# Patient Record
Sex: Male | Born: 1990 | Race: White | Hispanic: No | Marital: Single | Smoking: Never smoker
Health system: Southern US, Community
[De-identification: ages and names within clinical notes are randomized; demographics above are authoritative.]

## PROBLEM LIST (undated history)

## (undated) DIAGNOSIS — K219 Gastro-esophageal reflux disease without esophagitis: Secondary | ICD-10-CM

## (undated) DIAGNOSIS — R011 Cardiac murmur, unspecified: Secondary | ICD-10-CM

## (undated) DIAGNOSIS — F32A Depression, unspecified: Secondary | ICD-10-CM

## (undated) DIAGNOSIS — F329 Major depressive disorder, single episode, unspecified: Secondary | ICD-10-CM

## (undated) DIAGNOSIS — J45909 Unspecified asthma, uncomplicated: Secondary | ICD-10-CM

## (undated) HISTORY — DX: Major depressive disorder, single episode, unspecified: F32.9

## (undated) HISTORY — DX: Cardiac murmur, unspecified: R01.1

## (undated) HISTORY — DX: Gastro-esophageal reflux disease without esophagitis: K21.9

## (undated) HISTORY — DX: Depression, unspecified: F32.A

## (undated) HISTORY — PX: NO PAST SURGERIES: SHX2092

## (undated) HISTORY — DX: Unspecified asthma, uncomplicated: J45.909

---
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
## 2015-12-23 ENCOUNTER — Ambulatory Visit (INDEPENDENT_AMBULATORY_CARE_PROVIDER_SITE_OTHER): Payer: BC Managed Care – PPO | Admitting: Family Medicine

## 2015-12-23 ENCOUNTER — Encounter: Payer: Self-pay | Admitting: Family Medicine

## 2015-12-23 VITALS — BP 110/72 | HR 66 | Temp 98.7°F | Ht 68.8 in | Wt 155.4 lb

## 2015-12-23 DIAGNOSIS — F410 Panic disorder [episodic paroxysmal anxiety] without agoraphobia: Secondary | ICD-10-CM

## 2015-12-23 DIAGNOSIS — D229 Melanocytic nevi, unspecified: Secondary | ICD-10-CM

## 2015-12-23 MED ORDER — SERTRALINE HCL 50 MG PO TABS: ORAL_TABLET | ORAL | Status: AC

## 2015-12-23 NOTE — Assessment & Plan Note (Signed)
Patient's symptoms most consistent with anxiety and panic attacks. Unlikely to be a cardiac cause in a patient of his age with no risk factors, no family history, and no exertional component. Discussed treatment methods including SSRI, benzodiazepines, and/or therapy. Patient opted for SSRI. We will start him on Zoloft as outlined below. He is given return precautions.

## 2015-12-23 NOTE — Patient Instructions (Signed)
Nice to meet you. We're going to start you on Zoloft for anxiety. If you would like to see a therapist please let us know. Please monitor the mole and operative your head and if it changes further please let us know. If you develop persistent anxiety, chest pain, shortness of breath, palpitations, sweating, thoughts of harming your self or others, or any new or changing symptoms please seek medical attention.

## 2015-12-23 NOTE — Progress Notes (Signed)
Patient ID: Marcus Lynch, male   DOB: [birth year], 26 y.o.   MRN: [id number]  Tommi Rumps, MD Phone: [phone]  Marcus Lynch is a 25 y.o. male who presents today for new patient visit.  Anxiety: Patient notes history of anxiety in the past. Has a history of panic attacks as well. All since ninth grade. Notes these occur intermittently. He does get panic attacks frequently recently. Describes it as an anxiety and nervous sensation followed by tightness in his chest and mild difficulty breathing. No diaphoresis associated with this. Can occur at any time though have been occurring more often at night. Not exertional. Notes his family has a significant history of anxiety. No depression currently. No SI or HI. No family history of cardiac disease. Patient does not have a history of diabetes, hypertension, or hyperlipidemia. Has never smoked cigarettes. Are consistent with his prior panic attacks. No anxiety at this time. No chest pain or shortness of breath at this time.  Patient also reports a new mole on top of his head that has grown in size. Has not changed colors. Occasionally it does bleed.  Active Ambulatory Problems    Diagnosis Date Noted  . Panic attacks 12/23/2015  . Nevus 12/23/2015   Resolved Ambulatory Problems    Diagnosis Date Noted  . No Resolved Ambulatory Problems   Past Medical History  Diagnosis Date  . Depression   . GERD (gastroesophageal reflux disease)   . Heart murmur   . Asthma     Family History  Problem Relation Age of Onset  . Diabetes Father     Social History   Social History  . Marital Status: Single    Spouse Name: N/A  . Number of Children: N/A  . Years of Education: N/A   Occupational History  . Not on file.   Social History Main Topics  . Smoking status: Never Smoker   . Smokeless tobacco: Never Used  . Alcohol Use: No  . Drug Use: No  . Sexual Activity:    Partners: Female    Museum/gallery curator: None   Other Topics  Concern  . Not on file   Social History Narrative  . No narrative on file    ROS  General:  Negative for nexplained weight loss, fever Skin: Positive for new or changing mole, negative for sore that won't heal HEENT: Negative for trouble hearing, trouble seeing, ringing in ears, mouth sores, hoarseness, change in voice, dysphagia. CV:  Positive for chest pain, negative for dyspnea, edema, palpitations Resp: Negative for cough, dyspnea, hemoptysis GI: Negative for nausea, vomiting, diarrhea, constipation, abdominal pain, melena, hematochezia. GU: Negative for dysuria, incontinence, urinary hesitance, hematuria, vaginal or penile discharge, polyuria, sexual difficulty, lumps in testicle or breasts MSK: Negative for muscle cramps or aches, joint pain or swelling Neuro: Negative for headaches, weakness, numbness, dizziness, passing out/fainting Psych: Positive for anxiety, negative for depression, memory problems  Objective  Physical Exam Filed Vitals:   12/23/15 1529  BP: 110/72  Pulse: 66  Temp: 98.7 F (37.1 C)    BP Readings from Last 3 Encounters:  12/23/15 110/72   Wt Readings from Last 3 Encounters:  12/23/15 155 lb 6 oz (70.478 kg)    Physical Exam  Constitutional: He is well-developed, well-nourished, and in no distress.  HENT:  Head: Normocephalic and atraumatic.  Right Ear: External ear normal.  Left Ear: External ear normal.  Mouth/Throat: Oropharynx is clear and moist. No oropharyngeal exudate.  Eyes: Conjunctivae are normal. Pupils  are equal, round, and reactive to light.  Neck: Neck supple.  Cardiovascular: Normal rate, regular rhythm and normal heart sounds.   Pulmonary/Chest: Effort normal and breath sounds normal.  Abdominal: Soft. Bowel sounds are normal. He exhibits no distension. There is no tenderness. There is no rebound and no guarding.  Musculoskeletal: He exhibits no edema.  Lymphadenopathy:    He has no cervical adenopathy.  Neurological: He  is alert. Gait normal.  Skin: Skin is warm and dry. He is not diaphoretic.  Small 4-5 mm skin-colored nevus on the top right posterior portion of the patient's head with regular borders and uniform color  Psychiatric: Mood and affect normal.     Assessment/Plan:   Panic attacks Patient's symptoms most consistent with anxiety and panic attacks. Unlikely to be a cardiac cause in a patient of his age with no risk factors, no family history, and no exertional component. Discussed treatment methods including SSRI, benzodiazepines, and/or therapy. Patient opted for SSRI. We will start him on Zoloft as outlined below. He is given return precautions.  Nevus Appears benign. Has grown. Offered dermatology referral for consideration of removal given that it is on his scalp and does bleed though he deferred this at this time. We'll continue to monitor. He will let us know if it changes    No orders of the defined types were placed in this encounter.    Meds ordered this encounter  Medications  . sertraline (ZOLOFT) 50 MG tablet    Sig: Take 25 mg (1/2 tablet) by mouth daily for 7 days, then take 50 mg (1 tablet) by mouth daily    Dispense:  30 tablet    Refill:  [place], MD [place]

## 2015-12-23 NOTE — Progress Notes (Signed)
Pre visit review using our clinic review tool, if applicable. No additional management support is needed unless otherwise documented below in the visit note. 

## 2015-12-23 NOTE — Assessment & Plan Note (Signed)
Appears benign. Has grown. Offered dermatology referral for consideration of removal given that it is on his scalp and does bleed though he deferred this at this time. We'll continue to monitor. He will let us know if it changes

## 2015-12-31 ENCOUNTER — Encounter: Payer: Self-pay | Admitting: Family Medicine

## 2015-12-31 ENCOUNTER — Ambulatory Visit (INDEPENDENT_AMBULATORY_CARE_PROVIDER_SITE_OTHER): Payer: BC Managed Care – PPO | Admitting: Family Medicine

## 2015-12-31 VITALS — BP 128/80 | HR 70 | Temp 98.3°F | Ht 68.8 in | Wt 158.2 lb

## 2015-12-31 DIAGNOSIS — Z13220 Encounter for screening for lipoid disorders: Secondary | ICD-10-CM

## 2015-12-31 DIAGNOSIS — Z0001 Encounter for general adult medical examination with abnormal findings: Secondary | ICD-10-CM | POA: Insufficient documentation

## 2015-12-31 DIAGNOSIS — Z114 Encounter for screening for human immunodeficiency virus [HIV]: Secondary | ICD-10-CM

## 2015-12-31 DIAGNOSIS — Z111 Encounter for screening for respiratory tuberculosis: Secondary | ICD-10-CM

## 2015-12-31 DIAGNOSIS — Z0000 Encounter for general adult medical examination without abnormal findings: Secondary | ICD-10-CM

## 2015-12-31 DIAGNOSIS — F410 Panic disorder [episodic paroxysmal anxiety] without agoraphobia: Secondary | ICD-10-CM

## 2015-12-31 LAB — COMPREHENSIVE METABOLIC PANEL
ALBUMIN: 4.5 g/dL (ref 3.5–5.2)
ALK PHOS: 48 U/L (ref 39–117)
ALT: 20 U/L (ref 0–53)
AST: 20 U/L (ref 0–37)
BILIRUBIN TOTAL: 0.6 mg/dL (ref 0.2–1.2)
BUN: 11 mg/dL (ref 6–23)
CO2: 29 mEq/L (ref 19–32)
CREATININE: 0.9 mg/dL (ref 0.40–1.50)
Calcium: 9.4 mg/dL (ref 8.4–10.5)
Chloride: 103 mEq/L (ref 96–112)
GFR: 111.1 mL/min (ref >60.00)
GLUCOSE: 83 mg/dL (ref 70–99)
POTASSIUM: 4.1 meq/L (ref 3.5–5.1)
SODIUM: 138 meq/L (ref 135–145)
TOTAL PROTEIN: 7.3 g/dL (ref 6.0–8.3)

## 2015-12-31 LAB — LIPID PANEL
CHOL/HDL RATIO: 3
Cholesterol: 147 mg/dL (ref 0–200)
HDL: 51.4 mg/dL (ref >39.00)
LDL Cholesterol: 83 mg/dL (ref 0–99)
NONHDL: 95.48
Triglycerides: 61 mg/dL (ref 0.0–149.0)
VLDL: 12.2 mg/dL (ref 0.0–40.0)

## 2015-12-31 NOTE — Addendum Note (Signed)
Addended by: Durwin Glaze on: 12/31/2015 09:25 AM   Modules accepted: Orders

## 2015-12-31 NOTE — Patient Instructions (Signed)
Nice to see you. Please continue to exercise as you have been. Please start to incorporate vegetables and ear diet. Please continue to cut out sweet tea. He will return in 48-72 hours to have your PPD read.

## 2015-12-31 NOTE — Assessment & Plan Note (Signed)
Patient's symptoms most consistent with anxiety and panic attacks. Has been stable since her last visit a week ago. Is taking Zoloft. Unlikely cardiac given age and lack of risk factors. We'll continue Zoloft and continue to monitor.

## 2015-12-31 NOTE — Assessment & Plan Note (Signed)
Overall doing well. Weight is in the normal range. Advised to continue diet and exercise. HIV test will be done today. Other lab work as outlined below. Patient deferred tetanus shot today.

## 2015-12-31 NOTE — Progress Notes (Signed)
Pre visit review using our clinic review tool, if applicable. No additional management support is needed unless otherwise documented below in the visit note. 

## 2015-12-31 NOTE — Progress Notes (Signed)
Patient ID: Marcus Lynch, male   DOB: 1991/05/30, 25 y.o.   MRN: [id number]  Tommi Rumps, MD Phone: [phone]  Marcus Lynch is a 25 y.o. male who presents today for physical exam  Patient presents for physical exam today. He notes his anxiety is stable. Still having panic attacks several nights a week. Continues to be described as anxiety and a nervous sensation followed by tightness in his chest with mild difficulty breathing. No diaphoresis. No exertional component. No depression. No SI or HI. No family history of cardiac disease. No personal history of diabetes, hypertension, or hyperlipidemia. He has been on Zoloft for about a week.  Diet consists of eating whatever he wants. He eats fast food a couple times a week. Has stopped drinking sweet tea since her last visit. Eats 5 meals a day. Get specials at lunch and dinner. Exercises 3 times a week by lifting weights and also works as a Wellsite geologist physically active at work. He is unsure when his last tetanus shot was. No illicit drug use, no alcohol use, no tobacco use. No prior HIV test. He needs a PPD.  Active Ambulatory Problems    Diagnosis Date Noted  . Panic attacks 12/23/2015  . Nevus 12/23/2015  . Encounter for general adult medical examination with abnormal findings 12/31/2015   Resolved Ambulatory Problems    Diagnosis Date Noted  . No Resolved Ambulatory Problems   Past Medical History  Diagnosis Date  . Depression   . GERD (gastroesophageal reflux disease)   . Heart murmur   . Asthma     Family History  Problem Relation Age of Onset  . Diabetes Father     Social History   Social History  . Marital Status: Single    Spouse Name: N/A  . Number of Children: N/A  . Years of Education: N/A   Occupational History  . Not on file.   Social History Main Topics  . Smoking status: Never Smoker   . Smokeless tobacco: Never Used  . Alcohol Use: No  . Drug Use: No  . Sexual Activity:    Partners:  Female    Museum/gallery curator: None   Other Topics Concern  . Not on file   Social History Narrative    ROS  General:  Negative for nexplained weight loss, fever Skin: Negative for new or changing mole, sore that won't heal HEENT: Negative for trouble hearing, trouble seeing, ringing in ears, mouth sores, hoarseness, change in voice, dysphagia. CV:  Positive for chest pain, negative for dyspnea, edema, palpitations Resp: Negative for cough, dyspnea, hemoptysis GI: Negative for nausea, vomiting, diarrhea, constipation, abdominal pain, melena, hematochezia. GU: Negative for dysuria, incontinence, urinary hesitance, hematuria, vaginal or penile discharge, polyuria, sexual difficulty, lumps in testicle or breasts MSK: Negative for muscle cramps or aches, joint pain or swelling Neuro: Negative for headaches, weakness, numbness, dizziness, passing out/fainting Psych: Positive for anxiety, Negative for depression, memory problems  Objective  Physical Exam Filed Vitals:   12/31/15 0815  BP: 128/80  Pulse: 70  Temp: 98.3 F (36.8 C)    BP Readings from Last 3 Encounters:  12/31/15 128/80  12/23/15 110/72   Wt Readings from Last 3 Encounters:  12/31/15 158 lb 3.2 oz (71.759 kg)  12/23/15 155 lb 6 oz (70.478 kg)    Physical Exam  Constitutional: He is well-developed, well-nourished, and in no distress.  HENT:  Head: Normocephalic and atraumatic.  Right Ear: External ear normal.  Left Ear: External ear  normal.  Mouth/Throat: Oropharynx is clear and moist. No oropharyngeal exudate.  Eyes: Conjunctivae are normal. Pupils are equal, round, and reactive to light.  Neck: Neck supple.  Cardiovascular: Normal rate, regular rhythm and normal heart sounds.   Pulmonary/Chest: Effort normal and breath sounds normal.  Abdominal: Soft. Bowel sounds are normal. He exhibits no distension. There is no tenderness. There is no rebound and no guarding.  Musculoskeletal: He exhibits no  edema.  Lymphadenopathy:    He has no cervical adenopathy.  Neurological: He is alert. Gait normal.  Hearing intact to finger rub  Skin: Skin is warm and dry. He is not diaphoretic.  Psychiatric: Mood and affect normal.     Assessment/Plan:   Encounter for general adult medical examination with abnormal findings Overall doing well. Weight is in the normal range. Advised to continue diet and exercise. HIV test will be done today. Other lab work as outlined below. Patient deferred tetanus shot today.  Panic attacks Patient's symptoms most consistent with anxiety and panic attacks. Has been stable since her last visit a week ago. Is taking Zoloft. Unlikely cardiac given age and lack of risk factors. We'll continue Zoloft and continue to monitor.    Orders Placed This Encounter  Procedures  . Comp Met (CMET)  . HIV antibody (with reflex)  . Lipid Profile    Tommi Rumps, MD [place]

## 2016-01-01 ENCOUNTER — Encounter: Payer: BC Managed Care – PPO | Admitting: Family Medicine

## 2016-01-01 ENCOUNTER — Encounter: Payer: Self-pay | Admitting: Family Medicine

## 2016-01-01 LAB — HIV ANTIBODY (ROUTINE TESTING W REFLEX): HIV 1&2 Ab, 4th Generation: NONREACTIVE

## 2016-01-02 ENCOUNTER — Telehealth: Payer: Self-pay

## 2016-01-02 ENCOUNTER — Ambulatory Visit: Payer: BC Managed Care – PPO

## 2016-01-02 DIAGNOSIS — Z111 Encounter for screening for respiratory tuberculosis: Secondary | ICD-10-CM

## 2016-01-02 LAB — TB SKIN TEST
INDURATION: 0 mm
TB Skin Test: NEGATIVE

## 2016-01-02 NOTE — Telephone Encounter (Signed)
-----   Message from Leone Haven, MD sent at 01/01/2016  6:45 PM EDT ----- Please let the patient know that his lab work was unremarkable. Thanks.

## 2016-01-02 NOTE — Telephone Encounter (Signed)
Called patient.  No answer.

## 2016-01-03 NOTE — Telephone Encounter (Signed)
Spoke to patient. Gave lab results.

## 2016-01-30 ENCOUNTER — Ambulatory Visit: Payer: BC Managed Care – PPO | Admitting: Family Medicine

## 2016-01-30 DIAGNOSIS — Z0289 Encounter for other administrative examinations: Secondary | ICD-10-CM
